# Patient Record
Sex: Male | Born: 2000 | Race: White | Hispanic: No | Marital: Single | Smoking: Never smoker
Health system: Southern US, Community
[De-identification: ages and names within clinical notes are randomized; demographics above are authoritative.]

---
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
## 2018-01-18 IMAGING — DX DG WRIST COMPLETE 3+V*L*
4 series · 4 of 4 positions shown · non-contrast
Comparison: None.

CLINICAL DATA: Pain after fall

EXAM:
LEFT WRIST - COMPLETE 3+ VIEW

[wrist pa]
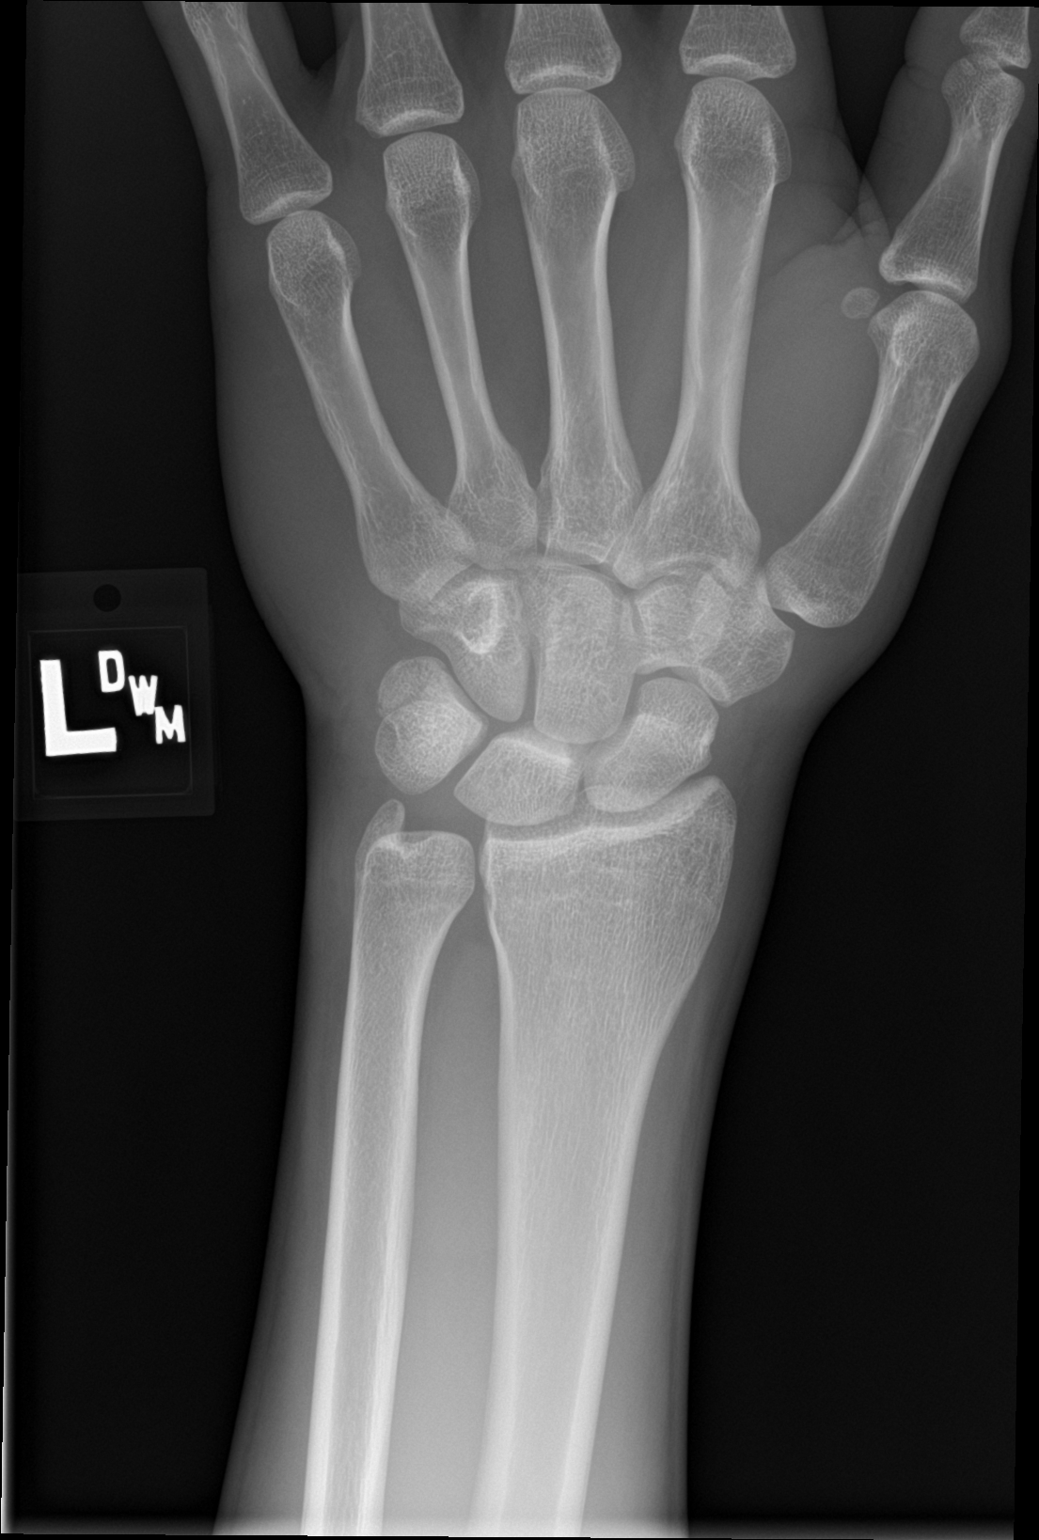

[wrist obl]
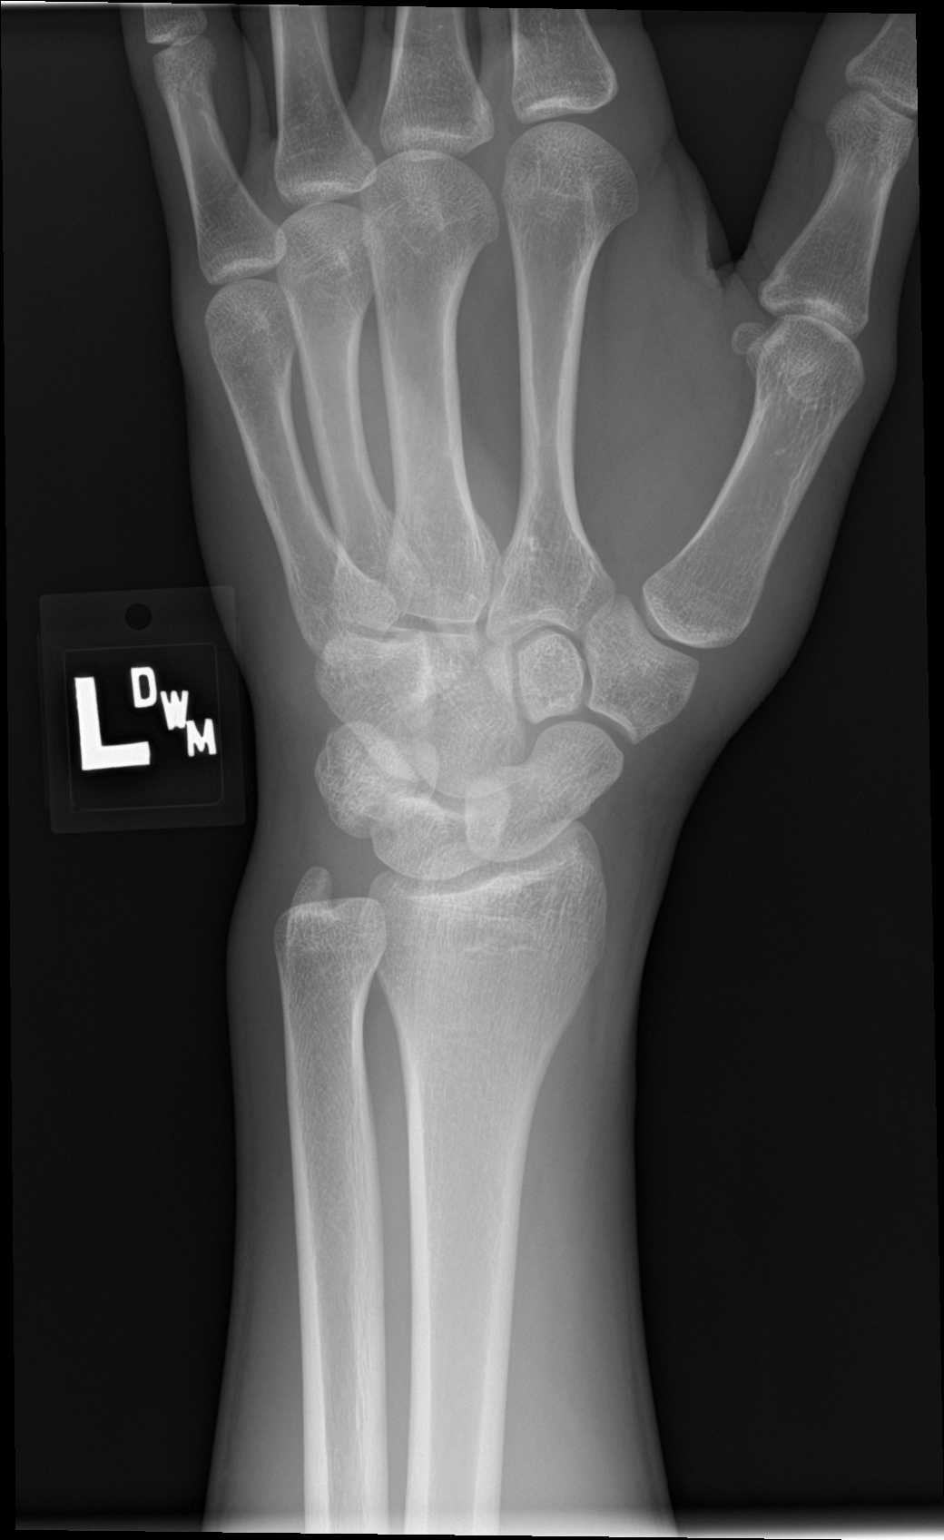

[wrist lat]
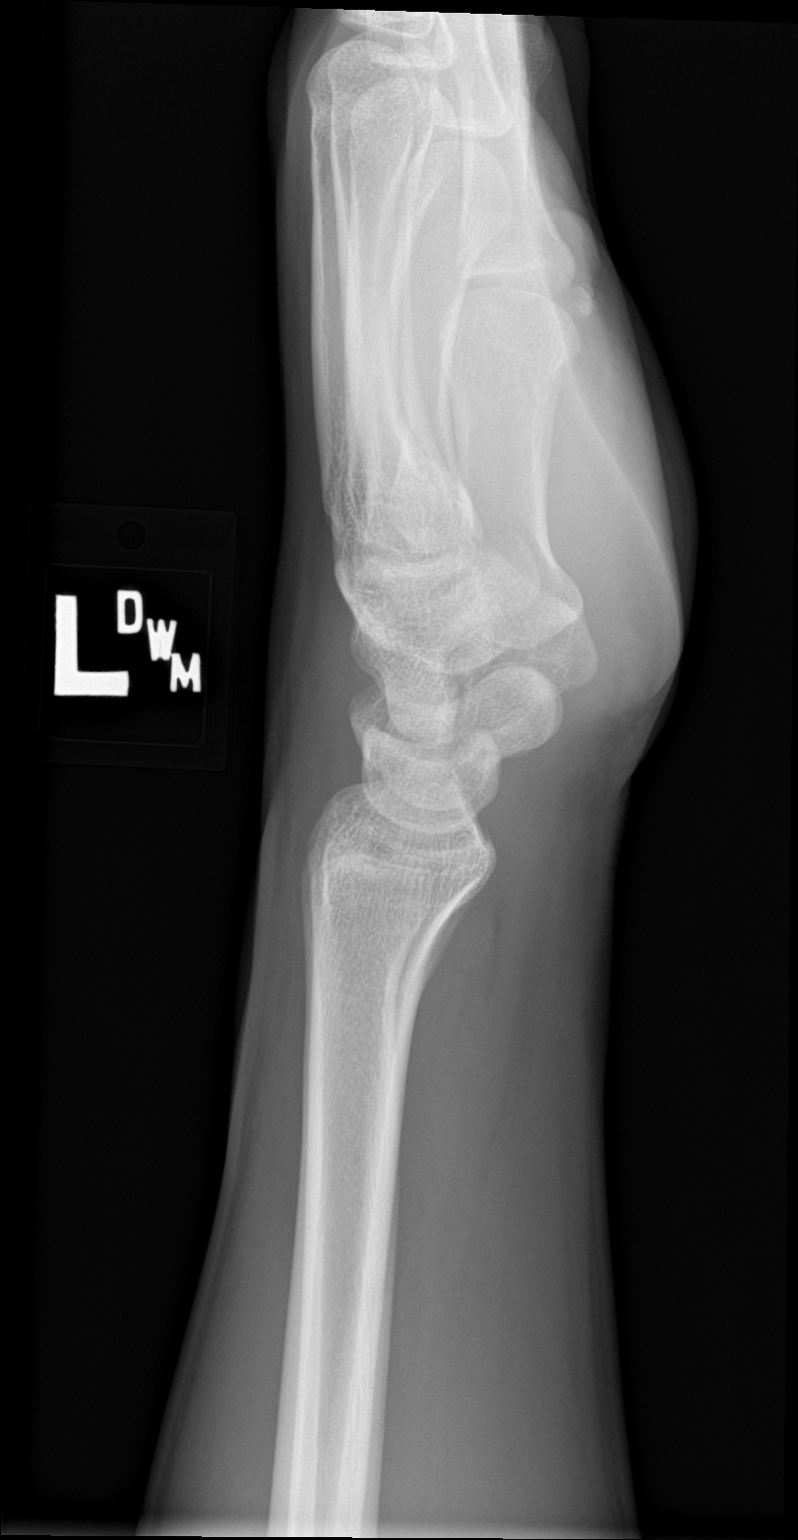

[wrist navicular]
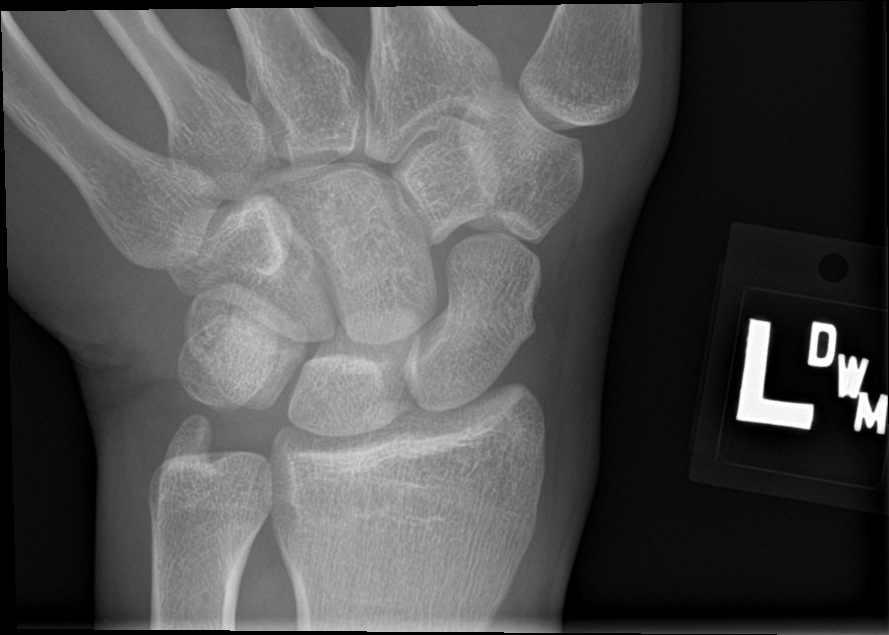

[4 of 4 positions shown; findings below may reference images not displayed]

FINDINGS: Mild irregularity of the triquetrum on the AP view is concerning for
a subtle fracture, not confirmed on the lateral view. No other acute
abnormalities.
IMPRESSION: Findings are concerning for a triquetrum fracture best appreciated
on the AP view.
